# Patient Record
Sex: Male | Born: 1985 | Race: Black or African American | Hispanic: No | Marital: Single | Smoking: Current some day smoker
Health system: Southern US, Community
[De-identification: ages and names within clinical notes are randomized; demographics above are authoritative.]

---

## 2014-12-06 ENCOUNTER — Emergency Department (HOSPITAL_COMMUNITY)
Admission: EM | Admit: 2014-12-06 | Discharge: 2014-12-06 | Disposition: A | Discharge status: Home / Self Care | Payer: Self-pay | Attending: Emergency Medicine | Admitting: Emergency Medicine

## 2014-12-06 ENCOUNTER — Encounter (HOSPITAL_COMMUNITY): Payer: Self-pay | Admitting: *Deleted

## 2014-12-06 ENCOUNTER — Emergency Department (HOSPITAL_COMMUNITY): Payer: Self-pay

## 2014-12-06 DIAGNOSIS — Z720 Tobacco use: Secondary | ICD-10-CM | POA: Insufficient documentation

## 2014-12-06 DIAGNOSIS — R079 Chest pain, unspecified: Secondary | ICD-10-CM

## 2014-12-06 DIAGNOSIS — R110 Nausea: Secondary | ICD-10-CM | POA: Insufficient documentation

## 2014-12-06 LAB — CBC WITH DIFFERENTIAL/PLATELET
BASOS PCT: 0 % (ref 0–1)
Basophils Absolute: 0.1 10*3/uL (ref 0.0–0.1)
EOS ABS: 0.2 10*3/uL (ref 0.0–0.7)
EOS PCT: 2 % (ref 0–5)
HEMATOCRIT: 39.2 % (ref 39.0–52.0)
Hemoglobin: 13.1 g/dL (ref 13.0–17.0)
Lymphocytes Relative: 19 % (ref 12–46)
Lymphs Abs: 2.1 10*3/uL (ref 0.7–4.0)
MCH: 29.6 pg (ref 26.0–34.0)
MCHC: 33.4 g/dL (ref 30.0–36.0)
MCV: 88.7 fL (ref 78.0–100.0)
MONO ABS: 0.7 10*3/uL (ref 0.1–1.0)
MONOS PCT: 6 % (ref 3–12)
NEUTROS PCT: 73 % (ref 43–77)
Neutro Abs: 8.1 10*3/uL — ABNORMAL HIGH (ref 1.7–7.7)
Platelets: 256 10*3/uL (ref 150–400)
RBC: 4.42 MIL/uL (ref 4.22–5.81)
RDW: 13.2 % (ref 11.5–15.5)
WBC: 11.2 10*3/uL — ABNORMAL HIGH (ref 4.0–10.5)

## 2014-12-06 LAB — COMPREHENSIVE METABOLIC PANEL
ALK PHOS: 58 U/L (ref 39–117)
ALT: 12 U/L (ref 0–53)
ANION GAP: 3 — AB (ref 5–15)
AST: 23 U/L (ref 0–37)
Albumin: 4.5 g/dL (ref 3.5–5.2)
BILIRUBIN TOTAL: 0.3 mg/dL (ref 0.3–1.2)
BUN: 13 mg/dL (ref 6–23)
CHLORIDE: 106 meq/L (ref 96–112)
CO2: 28 mmol/L (ref 19–32)
CREATININE: 1.16 mg/dL (ref 0.50–1.35)
Calcium: 9.4 mg/dL (ref 8.4–10.5)
GFR calc Af Amer: >90 mL/min (ref >90)
GFR, EST NON AFRICAN AMERICAN: 84 mL/min — AB (ref >90)
Glucose, Bld: 106 mg/dL — ABNORMAL HIGH (ref 70–99)
POTASSIUM: 3.5 mmol/L (ref 3.5–5.1)
SODIUM: 137 mmol/L (ref 135–145)
Total Protein: 7.6 g/dL (ref 6.0–8.3)

## 2014-12-06 MED ORDER — IBUPROFEN 800 MG PO TABS
800.0000 mg | ORAL_TABLET | Freq: Once | ORAL | Status: AC
  Administered 2014-12-06: 800 mg via ORAL
  Filled 2014-12-06: qty 1

## 2014-12-06 NOTE — ED Notes (Signed)
Pt c/o left sided chest pain that radiates to his back and states the pain gets worse when he lays down at night

## 2014-12-06 NOTE — Discharge Instructions (Signed)
[facility] Primary Care Doctor List    Kari Baars MD. Specialty: Pulmonary Disease Contact information: [address]  [address]  [place] [address]  [phone]   Syliva Overman, MD. Specialty: [facility] Medicine Contact information: [address]  [place] [address]  [phone]   Lilyan Punt, MD. Specialty: [facility] Medicine Contact information: [address]  [address]  [place] [address]  [phone]   Avon Gully, MD Specialty: Internal Medicine Contact information: [address] [place] [address]  [phone]   Catalina Pizza, MD. Specialty: Internal Medicine Contact information: [address]  [place] [address]  [phone]   Butch Penny, MD. Specialty: Family Medicine Contact information: [address]  [place] [address]  [phone]   John Giovanni, MD. Specialty: Family Medicine Contact information: [address]  [address]  [place] [address]  [phone]   Carylon Perches, MD. Specialty: Internal Medicine Contact information: [phone]  [address]  [place] [address]  [phone]     Chest Pain (Nonspecific) It is often hard to give a specific diagnosis for the cause of chest pain. There is always a chance that your pain could be related to something serious, such as a heart attack or a blood clot in the lungs. You need to follow up with your health care provider for further evaluation. CAUSES   Heartburn.  Pneumonia or bronchitis.  Anxiety or stress.  Inflammation around your heart (pericarditis) or lung (pleuritis or pleurisy).  A blood clot in the lung.  A collapsed lung (pneumothorax). It can develop suddenly on its own (spontaneous pneumothorax) or from trauma to the chest.  Shingles infection (herpes zoster virus). The chest wall is composed of bones, muscles, and cartilage. Any of these can be the source of the pain.  The bones can be bruised by  injury.  The muscles or cartilage can be strained by coughing or overwork.  The cartilage can be affected by inflammation and become sore (costochondritis). DIAGNOSIS  Lab tests or other studies may be needed to find the cause of your pain. Your health care provider may have you take a test called an ambulatory electrocardiogram (ECG). An ECG records your heartbeat patterns over a 24-hour period. You may also have other tests, such as:  Transthoracic echocardiogram (TTE). During echocardiography, sound waves are used to evaluate how blood flows through your heart.  Transesophageal echocardiogram (TEE).  Cardiac monitoring. This allows your health care provider to monitor your heart rate and rhythm in real time.  Holter monitor. This is a portable device that records your heartbeat and can help diagnose heart arrhythmias. It allows your health care provider to track your heart activity for several days, if needed.  Stress tests by exercise or by giving medicine that makes the heart beat faster. TREATMENT   Treatment depends on what may be causing your chest pain. Treatment may include:  Acid blockers for heartburn.  Anti-inflammatory medicine.  Pain medicine for inflammatory conditions.  Antibiotics if an infection is present.  You may be advised to change lifestyle habits. This includes stopping smoking and avoiding alcohol, caffeine, and chocolate.  You may be advised to keep your head raised (elevated) when sleeping. This reduces the chance of acid going backward from your stomach into your esophagus. Most of the time, nonspecific chest pain will improve within 2-3 days with rest and mild pain medicine.  HOME CARE INSTRUCTIONS   If antibiotics  were prescribed, take them as directed. Finish them even if you start to feel better.  For the next few days, avoid physical activities that bring on chest pain. Continue physical activities as directed.  Do not use any tobacco  products, including cigarettes, chewing tobacco, or electronic cigarettes.  Avoid drinking alcohol.  Only take medicine as directed by your health care provider.  Follow your health care provider's suggestions for further testing if your chest pain does not go away.  Keep any follow-up appointments you made. If you do not go to an appointment, you could develop lasting (chronic) problems with pain. If there is any problem keeping an appointment, call to reschedule. SEEK MEDICAL CARE IF:   Your chest pain does not go away, even after treatment.  You have a rash with blisters on your chest.  You have a fever. SEEK IMMEDIATE MEDICAL CARE IF:   You have increased chest pain or pain that spreads to your arm, neck, jaw, back, or abdomen.  You have shortness of breath.  You have an increasing cough, or you cough up blood.  You have severe back or abdominal pain.  You feel nauseous or vomit.  You have severe weakness.  You faint.  You have chills. This is an emergency. Do not wait to see if the pain will go away. Get medical help at once. Call your local emergency services (911 in [place] Do not drive yourself to the hospital. MAKE SURE YOU:   Understand these instructions.  Will watch your condition.  Will get help right away if you are not doing well or get worse. Document Released: 08/29/2005 Document Revised: 11/24/2013 Document Reviewed: 06/24/2008 [facility] Patient Information 2015 [place], [place]. This information is not intended to replace advice given to you by your health care provider. Make sure you discuss any questions you have with your health care provider.

## 2014-12-06 NOTE — ED Provider Notes (Signed)
CSN: [id number]     Arrival date & time 12/06/14  1912 History  This chart was scribed for Vida Roller, MD by Annye Asa, ED Scribe. This patient was seen in room [facility]2/[facility] and the patient's care was started at 8:46 PM.    Chief Complaint  Patient presents with  . Chest Pain   The history is provided by the patient. No language interpreter was used.     HPI Comments: Ronnie Olson is a 29 y.o. male who presents to the Emergency Department complaining of 4 days of bilateral chest pains, extending to both axilla. He also reports nausea and decreased appetite; he states that he is eating normally but is losing weight. Yesterday, patient reports that he felt dizzy during activity. 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 He denies diarrhea, leg swelling.   He visited the ED in [place] approx. 1 month PTA with heart palpitation sensations, back pain, weakness in his arms. He was told at that time that his symptoms were due to stress; he was given a prescription for ibuprofen.   He denies any personal or family history of cardiac issues or unexpected death.   Patient is a smoker. He is an occasional EtOH and marijuana user.   History reviewed. No pertinent past medical history. History reviewed. No pertinent past surgical history. History reviewed. No pertinent family history. History  Substance Use Topics  . Smoking status: Current Some Day Smoker -- 0.50 packs/day  . Smokeless tobacco: Not on file  . Alcohol Use: Yes     Comment: occasionally    Review of Systems  Cardiovascular: Positive for chest pain.  All other systems reviewed and are negative.   Allergies  Review of patient's allergies indicates no known allergies.  Home Medications   Prior to Admission medications   Not on File   BP 124/74 mmHg  Pulse 72  Temp(Src) 98.6 F (37 C) (Oral)  Resp 15  Ht  (1.803 m)  Wt 140 lb (63.504 kg)   BMI 19.53 kg/m2  SpO2 99% Physical Exam  Constitutional: He appears well-developed and well-nourished.  Thin but well appearing  HENT:  Head: Normocephalic and atraumatic.  Eyes: Conjunctivae are normal. Right eye exhibits no discharge. Left eye exhibits no discharge.  Pulmonary/Chest: Effort normal. No respiratory distress.  Neurological: He is alert. Coordination normal.  Skin: Skin is warm and dry. No rash noted. He is not diaphoretic. No erythema.  Psychiatric: He has a normal mood and affect.  Nursing note and vitals reviewed.   ED Course  Procedures   DIAGNOSTIC STUDIES: Oxygen Saturation is 100% on RA, normal by my interpretation.    COORDINATION OF CARE: 8:51 PM Discussed treatment plan with pt at bedside and pt agreed to plan.   Labs Review Labs Reviewed  CBC WITH DIFFERENTIAL - Abnormal; Notable for the following:    WBC 11.2 (*)    Neutro Abs 8.1 (*)    All other components within normal limits  COMPREHENSIVE METABOLIC PANEL - Abnormal; Notable for the following:    Glucose, Bld 106 (*)    GFR calc non Af Amer 84 (*)    Anion gap 3 (*)    All other components within normal limits    Imaging Review Dg Chest 2 View  12/06/2014   CLINICAL DATA:  LEFT side chest pain radiating to back, worse when lying down at night, unspecified duration, smoker  EXAM: CHEST  2 VIEW  COMPARISON:  None  FINDINGS: Normal heart size, mediastinal contours, and pulmonary vascularity.  Lungs clear.  No pneumothorax.  Bones unremarkable.  IMPRESSION: No acute abnormalities.   Electronically Signed   By: Ulyses Southward M.D.   On: 12/06/2014 20:08     EKG Interpretation   Date/Time:  Monday December 06 2014 21:01:06 EST Ventricular Rate:  74 PR Interval:  180 QRS Duration: 99 QT Interval:  386 QTC Calculation: 428 R Axis:   79 Text Interpretation:  Sinus rhythm ST elev, probable normal early repol  pattern No old tracing to compare Confirmed by Adianna Darwin  MD, Diona Peregoy ([id number])  on 12/06/2014  10:05:36 PM      MDM   Final diagnoses:  Chest pain, unspecified chest pain type    The patient is very low risk for heart disease, he has no exertional symptoms, his pain comes on at rest. His EKG shows early repolarization but no acute findings, his chest x-ray is negative and his lab work is unremarkable. I discussed these findings with the patient, he appears stable for discharge, he can follow-up in the outpatient setting. I have given him a list of family doctors and he is amenable to this plan.   I personally performed the services described in this documentation, which was scribed in my presence. The recorded information has been reviewed and is accurate.       Vida Roller, MD 12/06/14 2206

## 2015-08-23 ENCOUNTER — Emergency Department (HOSPITAL_COMMUNITY): Payer: Self-pay

## 2015-08-23 ENCOUNTER — Emergency Department (HOSPITAL_COMMUNITY)
Admission: EM | Admit: 2015-08-23 | Discharge: 2015-08-23 | Disposition: A | Discharge status: Home / Self Care | Payer: Self-pay | Attending: Emergency Medicine | Admitting: Emergency Medicine

## 2015-08-23 ENCOUNTER — Encounter (HOSPITAL_COMMUNITY): Payer: Self-pay | Admitting: Emergency Medicine

## 2015-08-23 DIAGNOSIS — Z720 Tobacco use: Secondary | ICD-10-CM | POA: Insufficient documentation

## 2015-08-23 DIAGNOSIS — M5432 Sciatica, left side: Secondary | ICD-10-CM | POA: Insufficient documentation

## 2015-08-23 MED ORDER — PREDNISONE 50 MG PO TABS
60.0000 mg | ORAL_TABLET | Freq: Once | ORAL | Status: AC
  Administered 2015-08-23: 60 mg via ORAL
  Filled 2015-08-23 (×2): qty 1

## 2015-08-23 MED ORDER — HYDROCODONE-ACETAMINOPHEN 5-325 MG PO TABS: 1.0000 | ORAL_TABLET | ORAL | Status: DC | PRN

## 2015-08-23 MED ORDER — PREDNISONE 10 MG PO TABS: ORAL_TABLET | ORAL | Status: AC

## 2015-08-23 MED ORDER — HYDROCODONE-ACETAMINOPHEN 5-325 MG PO TABS
1.0000 | ORAL_TABLET | Freq: Once | ORAL | Status: AC
  Administered 2015-08-23: 1 via ORAL
  Filled 2015-08-23: qty 1

## 2015-08-23 MED ORDER — METHOCARBAMOL 500 MG PO TABS
1000.0000 mg | ORAL_TABLET | Freq: Once | ORAL | Status: AC
  Administered 2015-08-23: 1000 mg via ORAL
  Filled 2015-08-23: qty 2

## 2015-08-23 MED ORDER — METHOCARBAMOL 500 MG PO TABS: 1000.0000 mg | ORAL_TABLET | Freq: Four times a day (QID) | ORAL | Status: AC

## 2015-08-23 NOTE — ED Notes (Addendum)
Pt reports was lifting a crate of potatoes at work and reports back pain ever since. Pt denies any gi/gu symptoms. Pt reports pain is radiating to left leg.

## 2015-08-23 NOTE — Discharge Instructions (Signed)
Sciatica °Sciatica is pain, weakness, numbness, or tingling along the path of the sciatic nerve. The nerve starts in the lower back and runs down the back of each leg. The nerve controls the muscles in the lower leg and in the back of the knee, while also providing sensation to the back of the thigh, lower leg, and the sole of your foot. Sciatica is a symptom of another medical condition. For instance, nerve damage or certain conditions, such as a herniated disk or bone spur on the spine, pinch or put pressure on the sciatic nerve. This causes the pain, weakness, or other sensations normally associated with sciatica. Generally, sciatica only affects one side of the body. °CAUSES  °· Herniated or slipped disc. °· Degenerative disk disease. °· A pain disorder involving the narrow muscle in the buttocks (piriformis syndrome). °· Pelvic injury or fracture. °· Pregnancy. °· Tumor (rare). °SYMPTOMS  °Symptoms can vary from mild to very severe. The symptoms usually travel from the low back to the buttocks and down the back of the leg. Symptoms can include: °· Mild tingling or dull aches in the lower back, leg, or hip. °· Numbness in the back of the calf or sole of the foot. °· Burning sensations in the lower back, leg, or hip. °· Sharp pains in the lower back, leg, or hip. °· Leg weakness. °· Severe back pain inhibiting movement. °These symptoms may get worse with coughing, sneezing, laughing, or prolonged sitting or standing. Also, being overweight may worsen symptoms. °DIAGNOSIS  °Your caregiver will perform a physical exam to look for common symptoms of sciatica. He or she may ask you to do certain movements or activities that would trigger sciatic nerve pain. Other tests may be performed to find the cause of the sciatica. These may include: °· Blood tests. °· X-rays. °· Imaging tests, such as an MRI or CT scan. °TREATMENT  °Treatment is directed at the cause of the sciatic pain. Sometimes, treatment is not necessary  and the pain and discomfort goes away on its own. If treatment is needed, your caregiver may suggest: °· Over-the-counter medicines to relieve pain. °· Prescription medicines, such as anti-inflammatory medicine, muscle relaxants, or narcotics. °· Applying heat or ice to the painful area. °· Steroid injections to lessen pain, irritation, and inflammation around the nerve. °· Reducing activity during periods of pain. °· Exercising and stretching to strengthen your abdomen and improve flexibility of your spine. Your caregiver may suggest losing weight if the extra weight makes the back pain worse. °· Physical therapy. °· Surgery to eliminate what is pressing or pinching the nerve, such as a bone spur or part of a herniated disk. °HOME CARE INSTRUCTIONS  °· Only take over-the-counter or prescription medicines for pain or discomfort as directed by your caregiver. °· Apply ice to the affected area for 20 minutes, 3-4 times a day for the first 48-72 hours. Then try heat in the same way. °· Exercise, stretch, or perform your usual activities if these do not aggravate your pain. °· Attend physical therapy sessions as directed by your caregiver. °· Keep all follow-up appointments as directed by your caregiver. °· Do not wear high heels or shoes that do not provide proper support. °· Check your mattress to see if it is too soft. A firm mattress may lessen your pain and discomfort. °SEEK IMMEDIATE MEDICAL CARE IF:  °· You lose control of your bowel or bladder (incontinence). °· You have increasing weakness in the lower back, pelvis, buttocks,   or legs.  You have redness or swelling of your back.  You have a burning sensation when you urinate.  You have pain that gets worse when you lie down or awakens you at night.  Your pain is worse than you have experienced in the past.  Your pain is lasting longer than 4 weeks.  You are suddenly losing weight without reason. MAKE SURE YOU:  Understand these  instructions.  Will watch your condition.  Will get help right away if you are not doing well or get worse. Document Released: 11/13/2001 Document Revised: 05/20/2012 Document Reviewed: 03/30/2012 [facility] Patient Information 2015 [place], [place]. This information is not intended to replace advice given to you by your health care provider. Make sure you discuss any questions you have with your health care provider.   Take your next dose of prednisone tomorrow evening.  Use the the other medicines as directed.  Do not drive within 4 hours of taking hydrocodone as this will make you drowsy.  Avoid lifting,  Bending,  Twisting or any other activity that worsens your pain over the next week.  Apply a heating pad to your lower back for 20 minutes 3 times daily  You should get rechecked if your symptoms are not better over the next 5 days,  Or you develop increased pain,  Weakness in your leg(s) or loss of bladder or bowel function - these are symptoms of a worsening injury.

## 2015-08-23 NOTE — ED Provider Notes (Signed)
CSN: [id number]     Arrival date & time 08/23/15  1622 History   First MD Initiated Contact with Patient 08/23/15 1641     Chief Complaint  Patient presents with  . Back Pain     (Consider location/radiation/quality/duration/timing/severity/associated sxs/prior Treatment) The history is provided by the patient and the spouse.   Ronnie Olson is a 29 y.o. male presenting with acute low back pain with radiation into his left lateral mid thigh.  He was lifting a crate of potatoes at work [employer]) 4 days ago when he felt a pulling type strain when he bent to put the box down, with worsened pain since.  Pain is sharp and radiates to his mid left thigh and is worsened with movement in certain positions.  He is able to get relief when supine, but has increased pain briefly while his muscles "relax" when he first lies down.  He has borrowed a baclofen tablet from his mother which has helped his symptoms briefly.  He denies any urinary or fecal incontinence or retention.  He is no IV drug use, no cancer history.  He denies prior history of low back pain or injury.    History reviewed. No pertinent past medical history. History reviewed. No pertinent past surgical history. History reviewed. No pertinent family history. Social History  Substance Use Topics  . Smoking status: Current Some Day Smoker -- 0.50 packs/day  . Smokeless tobacco: None  . Alcohol Use: Yes     Comment: occasionally    Review of Systems  Constitutional: Negative for fever.  Respiratory: Negative for shortness of breath.   Cardiovascular: Negative for chest pain and leg swelling.  Gastrointestinal: Negative for abdominal pain, constipation and abdominal distention.  Genitourinary: Negative for dysuria, urgency, frequency, flank pain and difficulty urinating.  Musculoskeletal: Positive for back pain. Negative for joint swelling and gait problem.  Skin: Negative for rash.  Neurological: Negative for weakness and  numbness.      Allergies  Review of patient's allergies indicates no known allergies.  Home Medications   Prior to Admission medications   Medication Sig Start Date End Date Taking? Authorizing Provider  acetaminophen (SM 8 HOUR PAIN RELIEF) 650 MG CR tablet Take 650 mg by mouth every 8 (eight) hours as needed for pain.   Yes Historical Provider, MD   BP 126/70 mmHg  Pulse 68  Temp(Src) 98.1 F (36.7 C) (Oral)  Resp 16  Ht  (1.778 m)  Wt 140 lb (63.504 kg)  BMI 20.09 kg/m2  SpO2 100% Physical Exam  Constitutional: He appears well-developed and well-nourished.  HENT:  Head: Normocephalic.  Eyes: Conjunctivae are normal.  Neck: Normal range of motion. Neck supple.  Cardiovascular: Normal rate and intact distal pulses.   Pedal pulses normal.  Pulmonary/Chest: Effort normal.  Abdominal: Soft. Bowel sounds are normal. He exhibits no distension and no mass.  Musculoskeletal: Normal range of motion. He exhibits no edema.       Lumbar back: He exhibits tenderness. He exhibits no swelling, no edema and no spasm.  Neurological: He is alert. He has normal strength. He displays no atrophy and no tremor. No sensory deficit. Gait normal.  Reflex Scores:      Patellar reflexes are 2+ on the right side and 2+ on the left side.      Achilles reflexes are 2+ on the right side and 2+ on the left side. No strength deficit noted in hip and knee flexor and extensor muscle groups.  Ankle  flexion and extension intact.  Bilateral positive straight leg raise at 30.  Skin: Skin is warm and dry.  Psychiatric: He has a normal mood and affect.  Nursing note and vitals reviewed.   ED Course  Procedures (including critical care time) Labs Review Labs Reviewed - No data to display  Imaging Review Dg Lumbar Spine Complete  08/23/2015   CLINICAL DATA:  Back pain since lifting a crate of potatoes at work, low back pain radiating to LEFT leg  EXAM: LUMBAR SPINE - COMPLETE 4+ VIEW  COMPARISON:   None  FINDINGS: Five non-rib-bearing lumbar vertebra.  Osseous mineralization normal.  Vertebral body and disc space heights maintained.  No acute fracture, subluxation or bone destruction.  No spondylolysis.  SI joints symmetric.  IMPRESSION: No acute abnormalities.   Electronically Signed   By: Ulyses Southward M.D.   On: 08/23/2015 18:11   I have personally reviewed and evaluated these images and lab results as part of my medical decision-making.   EKG Interpretation None      MDM   Final diagnoses:  Sciatica neuralgia, left    Pt was given Robaxin, prednisone and hydrocodone.  Pain improving.  xrays negative for acute injury.  He will be prescribed prednisone taper, hydrocodone, flexeril in place of robaxin given pt will be self pay for insurance.  Advised heat tx, activity as tolerated, f/u with pcp if sx persist or worsen.    Burgess Amor, PA-C 08/23/15 1854  Mancel Bale, MD 08/23/15 [phone]

## 2016-12-24 IMAGING — DX DG LUMBAR SPINE COMPLETE 4+V
5 series · 5 of 5 positions shown · non-contrast
Comparison: None

CLINICAL DATA: Back pain since lifting a crate of potatoes at work,
low back pain radiating to LEFT leg

EXAM:
LUMBAR SPINE - COMPLETE 4+ VIEW

[l-spine ap]
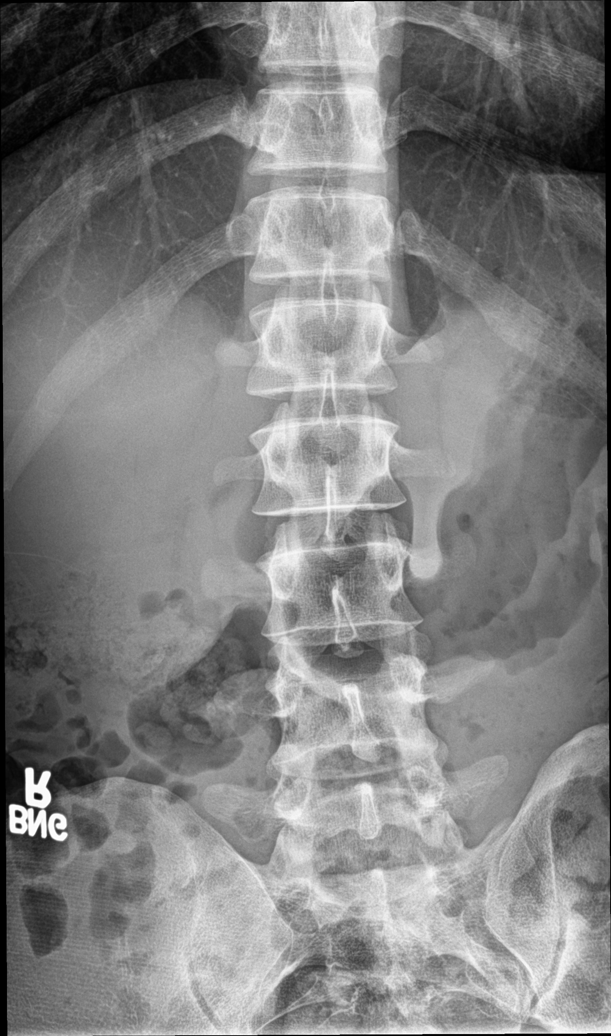

[l-spine obl (1 of 2)]
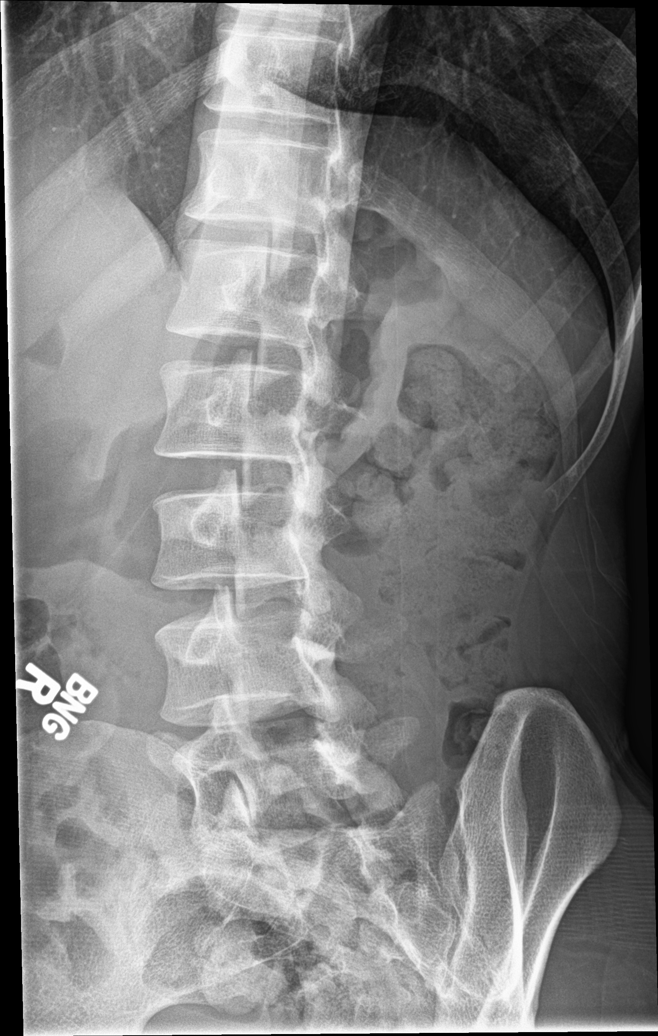

[l-spine obl (2 of 2)]
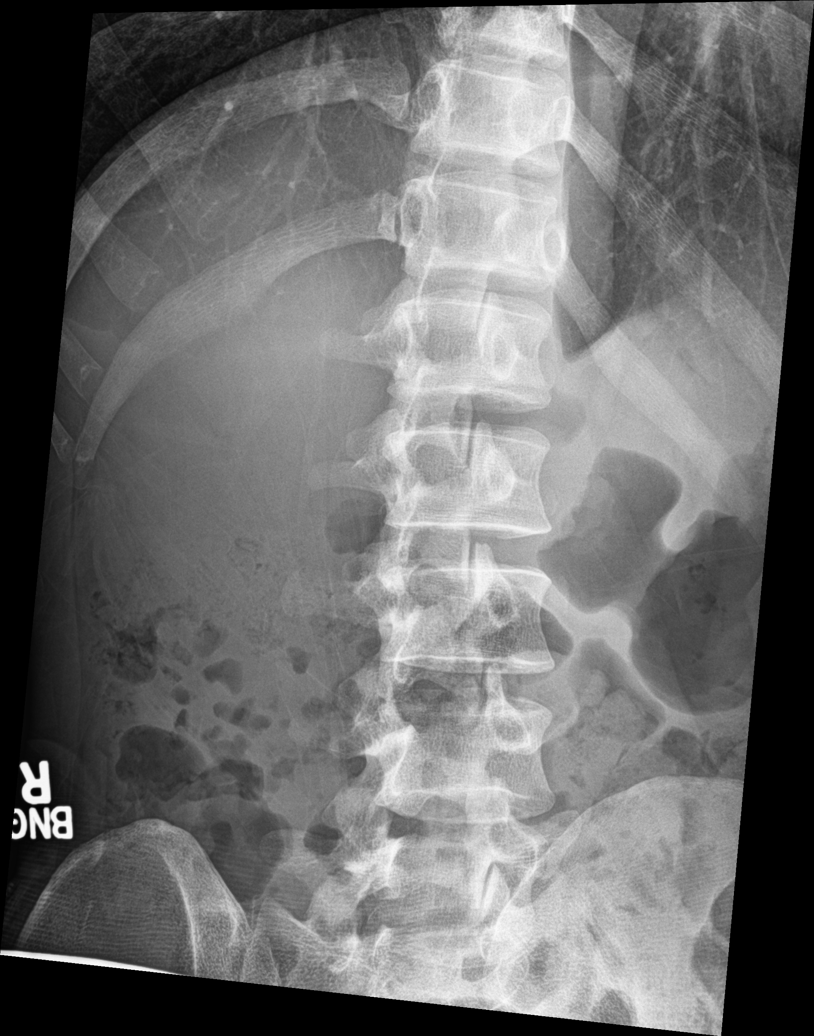

[l-spine lat]
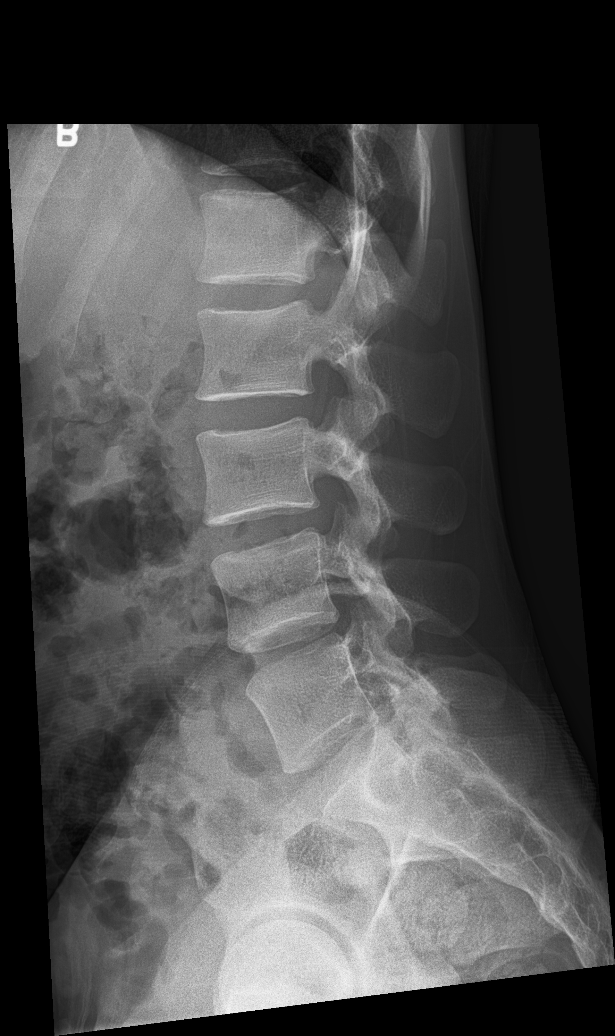

[l-spine spot]
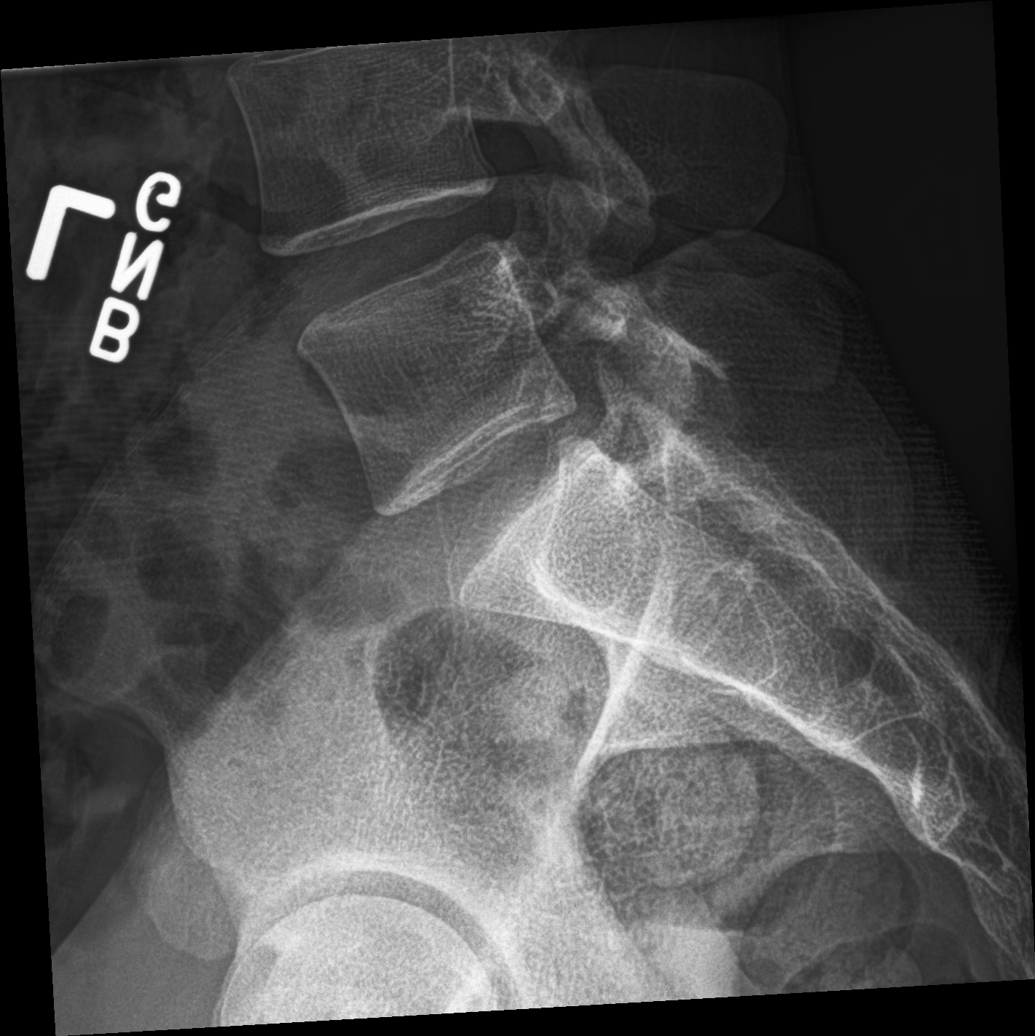

[5 of 5 positions shown; findings below may reference images not displayed]

FINDINGS: Five non-rib-bearing lumbar vertebra.

Osseous mineralization normal.

Vertebral body and disc space heights maintained.

No acute fracture, subluxation or bone destruction.

No spondylolysis.

SI joints symmetric.
IMPRESSION: No acute abnormalities.

## 2017-01-17 ENCOUNTER — Emergency Department (HOSPITAL_COMMUNITY): Payer: Self-pay

## 2017-01-17 ENCOUNTER — Emergency Department (HOSPITAL_COMMUNITY)
Admission: EM | Admit: 2017-01-17 | Discharge: 2017-01-17 | Disposition: A | Discharge status: Home / Self Care | Payer: Self-pay | Attending: Emergency Medicine | Admitting: Emergency Medicine

## 2017-01-17 ENCOUNTER — Encounter (HOSPITAL_COMMUNITY): Payer: Self-pay

## 2017-01-17 DIAGNOSIS — R0602 Shortness of breath: Secondary | ICD-10-CM | POA: Insufficient documentation

## 2017-01-17 DIAGNOSIS — F17210 Nicotine dependence, cigarettes, uncomplicated: Secondary | ICD-10-CM | POA: Insufficient documentation

## 2017-01-17 DIAGNOSIS — R0789 Other chest pain: Secondary | ICD-10-CM | POA: Insufficient documentation

## 2017-01-17 DIAGNOSIS — R42 Dizziness and giddiness: Secondary | ICD-10-CM | POA: Insufficient documentation

## 2017-01-17 DIAGNOSIS — R079 Chest pain, unspecified: Secondary | ICD-10-CM

## 2017-01-17 DIAGNOSIS — Z7982 Long term (current) use of aspirin: Secondary | ICD-10-CM | POA: Insufficient documentation

## 2017-01-17 DIAGNOSIS — R110 Nausea: Secondary | ICD-10-CM | POA: Insufficient documentation

## 2017-01-17 LAB — CBC
HEMATOCRIT: 40.9 % (ref 39.0–52.0)
Hemoglobin: 13.9 g/dL (ref 13.0–17.0)
MCH: 30.1 pg (ref 26.0–34.0)
MCHC: 34 g/dL (ref 30.0–36.0)
MCV: 88.5 fL (ref 78.0–100.0)
PLATELETS: 239 10*3/uL (ref 150–400)
RBC: 4.62 MIL/uL (ref 4.22–5.81)
RDW: 13.4 % (ref 11.5–15.5)
WBC: 8.1 10*3/uL (ref 4.0–10.5)

## 2017-01-17 LAB — BASIC METABOLIC PANEL
Anion gap: 6 (ref 5–15)
BUN: 13 mg/dL (ref 6–20)
CO2: 29 mmol/L (ref 22–32)
Calcium: 9.5 mg/dL (ref 8.9–10.3)
Chloride: 102 mmol/L (ref 101–111)
Creatinine, Ser: 1.09 mg/dL (ref 0.61–1.24)
GFR calc Af Amer: >60 mL/min (ref >60)
GFR calc non Af Amer: >60 mL/min (ref >60)
Glucose, Bld: 89 mg/dL (ref 65–99)
POTASSIUM: 4 mmol/L (ref 3.5–5.1)
Sodium: 137 mmol/L (ref 135–145)

## 2017-01-17 LAB — TROPONIN I: Troponin I: <0.03 ng/mL (ref <0.03)

## 2017-01-17 NOTE — Discharge Instructions (Signed)
Tests were all normal. Tylenol or ibuprofen for pain. Recommend primary care follow-up

## 2017-01-17 NOTE — ED Provider Notes (Signed)
[facility]-EM[facility]Y DEPT Provider Note   CSN: [id number] Arrival date & time: 01/17/17  1912 By signing my name below, I, Ronnie Olson, attest that this documentation has been prepared under the direction and in the presence of Donnetta HutchingBrian Tekoa Amon, MD . Electronically Signed: Levon HedgerElizabeth Olson, Scribe. 01/17/2017. 11:38 PM.   History   Chief Complaint Chief Complaint  Patient presents with  . Chest Pain   HPI Ronnie KyleJeremy Sedlar is an otherwise healthy 31 y.o. male who presents to the Emergency Department complaining of sudden onset chest pain which began tonight. He describes the pain as left sided tightness and spasms which lasted for about 1 hour. Per pt, the pain was alleviated by taking aspirin and getting fresh air outside. He notes associated lightheadedness, left arm weakness, SOB, and nausea. 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 Pt states this is the second time he has had this type of pain. The patient is currently on no regular medications. No personal or family cardiac history. He is a current 8 cigarette per day smoker and endorses occasional alcohol use. He denies any diaphoresis, and has no other complaints at this time.   The history is provided by the patient. No language interpreter was used.    History reviewed. No pertinent past medical history.  There are no active problems to display for this patient.   History reviewed. No pertinent surgical history.   Home Medications    Prior to Admission medications   Medication Sig Start Date End Date Taking? Authorizing Provider  aspirin 325 MG tablet Take 650 mg by mouth once.   Yes Historical Provider, MD  predniSONE (DELTASONE) 10 MG tablet 6, 5, 4, 3, 2 then 1 tablet by mouth daily for 6 days total. 08/23/15   Burgess AmorJulie Idol, PA-C    Family History History reviewed. No pertinent family history.  Social History Social History  Substance Use Topics  . Smoking status: Current Some Day Smoker    Packs/day: 0.50  . Smokeless  tobacco: Never Used  . Alcohol use Yes     Comment: occasionally    Allergies   Patient has no known allergies.  Review of Systems Review of Systems 10 systems reviewed and all are negative for acute change except as noted in the HPI.  Physical Exam Updated Vital Signs BP 101/70   Pulse 65   Temp 98.9 F (37.2 C)   Resp 14   Ht 5\' 10"  (1.778 m)   Wt 140 lb (63.5 kg)   SpO2 100%   BMI 20.09 kg/m   Physical Exam  Constitutional: He is oriented to person, place, and time. He appears well-developed and well-nourished.  HENT:  Head: Normocephalic and atraumatic.  Eyes: Conjunctivae are normal.  Neck: Neck supple.  Cardiovascular: Normal rate and regular rhythm.   Pulmonary/Chest: Effort normal and breath sounds normal.  Abdominal: Soft. Bowel sounds are normal.  Musculoskeletal: Normal range of motion.  Neurological: He is alert and oriented to person, place, and time.  Skin: Skin is warm and dry.  Psychiatric: He has a normal mood and affect. His behavior is normal.  Nursing note and vitals reviewed.  ED Treatments / Results  DIAGNOSTIC STUDIES:  Oxygen Saturation is 99% on RA, normal by my interpretation.    COORDINATION OF CARE:  9:28 PM Will order basic cardiac work-up. Discussed treatment plan with pt at bedside and pt agreed to plan.   Labs (all labs ordered are listed, but only abnormal results are  displayed) Labs Reviewed  BASIC METABOLIC PANEL  CBC  TROPONIN I    EKG  EKG Interpretation  Date/Time:  Thursday January 17 2017 19:17:58 EST Ventricular Rate:  82 PR Interval:  180 QRS Duration: 92 QT Interval:  360 QTC Calculation: 420 R Axis:   71 Text Interpretation:  Normal sinus rhythm with sinus arrhythmia Possible Left atrial enlargement Left ventricular hypertrophy Abnormal ECG Confirmed by Adriana Simas  MD, Lavell Supple ([id number]) on 01/17/2017 10:05:30 PM       Radiology Dg Chest 2 View  Result Date: 01/17/2017 CLINICAL DATA:  Chest pain, weakness,  dizziness shortness of breath. EXAM: CHEST  2 VIEW COMPARISON:  None. FINDINGS: The heart size and mediastinal contours are within normal limits. Both lungs are clear. The visualized skeletal structures are unremarkable. IMPRESSION: Normal chest x-ray. Electronically Signed   By: Rudie Meyer M.D.   On: 01/17/2017 19:56    Procedures Procedures (including critical care time)  Medications Ordered in ED Medications - No data to display   Initial Impression / Assessment and Plan / ED Course  I have reviewed the triage vital signs and the nursing notes.  Pertinent labs & imaging results that were available during my care of the patient were reviewed by me and considered in my medical decision making (see chart for details).     Patient is very low risk for ACS or PE. Screening labs, chest x-ray, EKG, troponin all negative.  Final Clinical Impressions(s) / ED Diagnoses   Final diagnoses:  Chest pain, unspecified type    New Prescriptions New Prescriptions   No medications on file  I personally performed the services described in this documentation, which was scribed in my presence. The recorded information has been reviewed and is accurate.     Donnetta Hutching, MD 01/17/17 [phone]

## 2017-01-17 NOTE — ED Triage Notes (Signed)
I was at work and it felt like someone had hit me in my chest bone.  Then I was having spasms in the left side of my chest and my left arm was feeling weak.  I became light headed and went to get something to drink to see if I felt better, but the pain in my left chest persisted.  It is tender in my chest in some spots.  I was also having a hard time breathing.  He has been evaluated for chest pain before per s/o.  He took two regular strength aspirin before coming here.

## 2018-05-21 IMAGING — DX DG CHEST 2V
2 series · 2 of 2 positions shown · non-contrast
Comparison: None.

CLINICAL DATA: Chest pain, weakness, dizziness shortness of breath.

EXAM:
CHEST  2 VIEW

[chest pa]
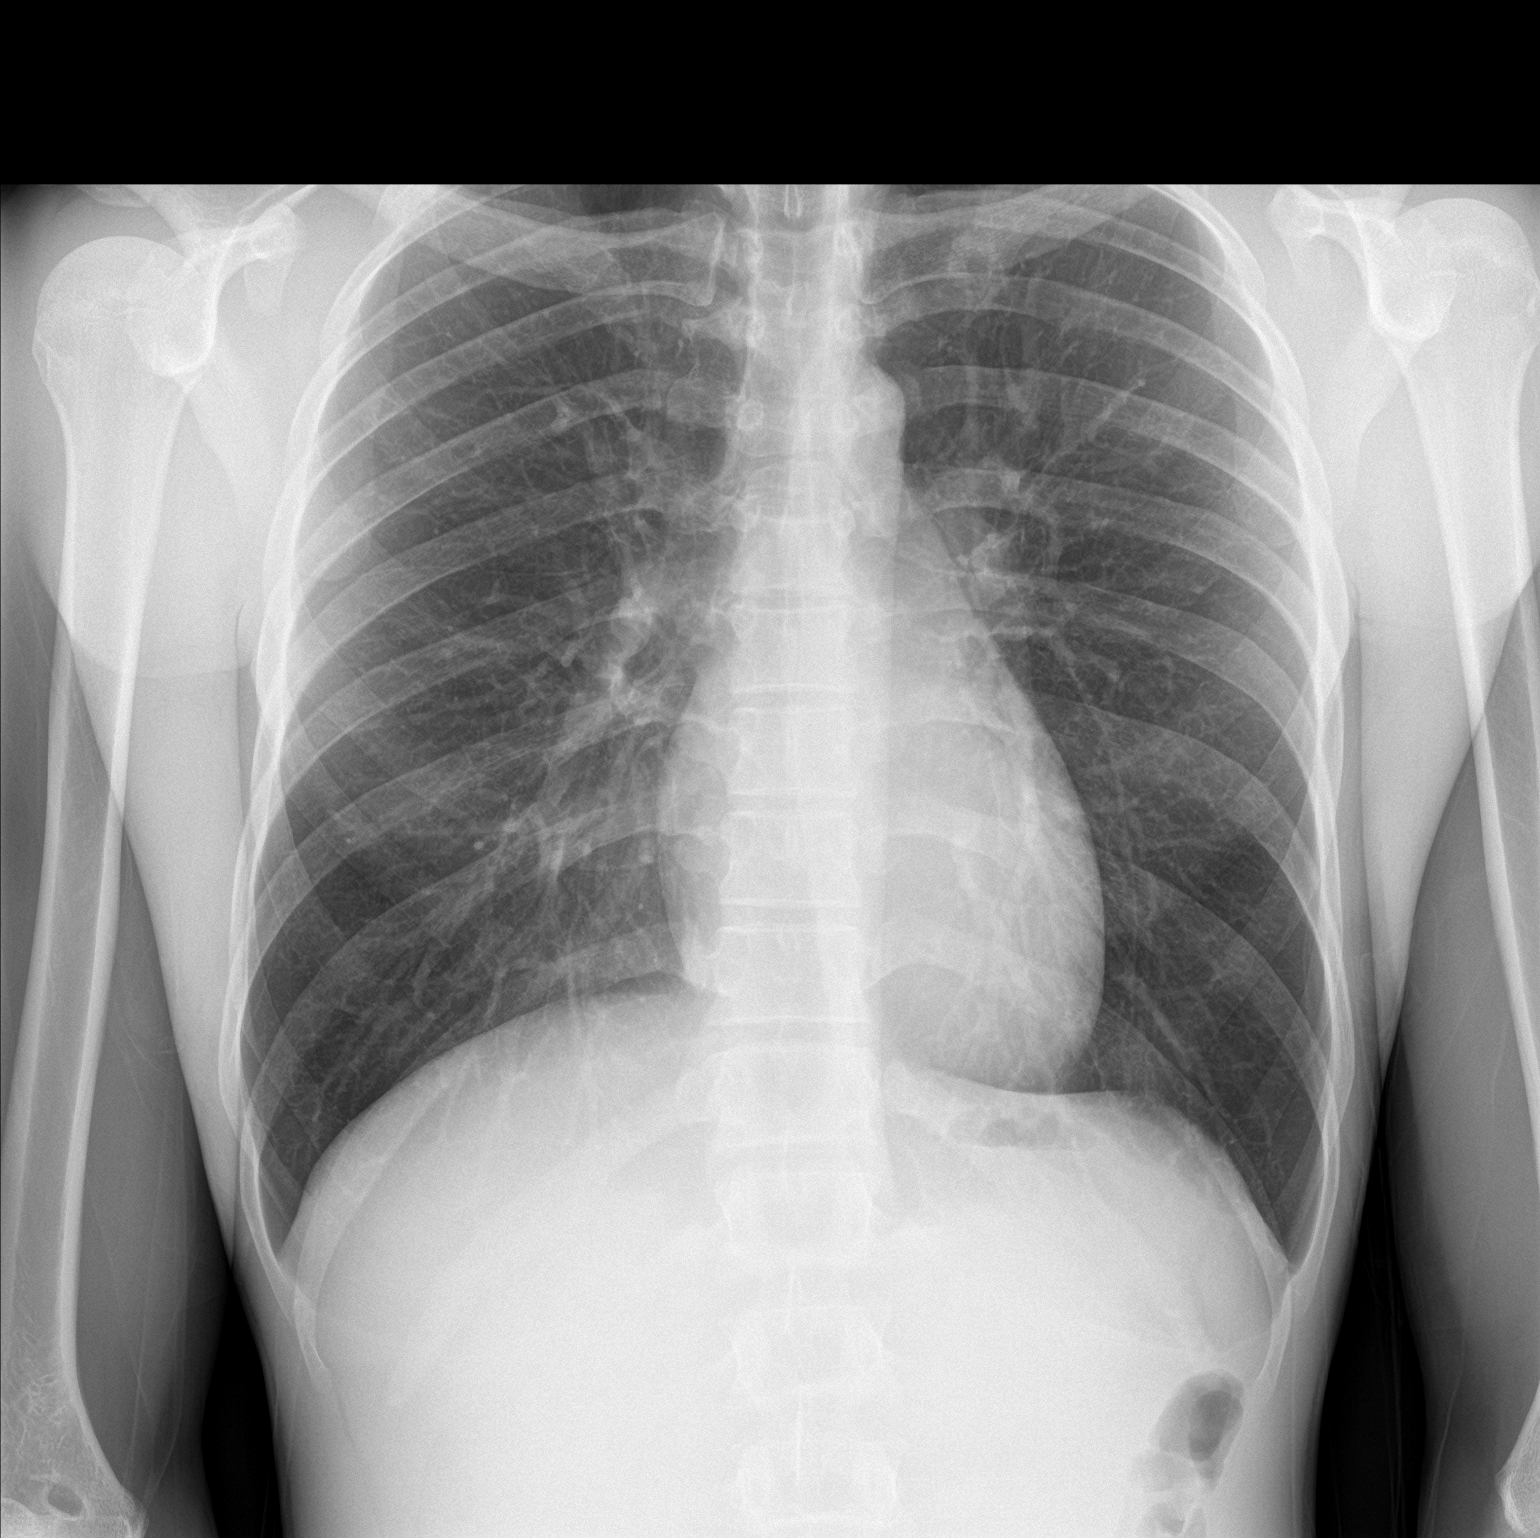

[chest lat]
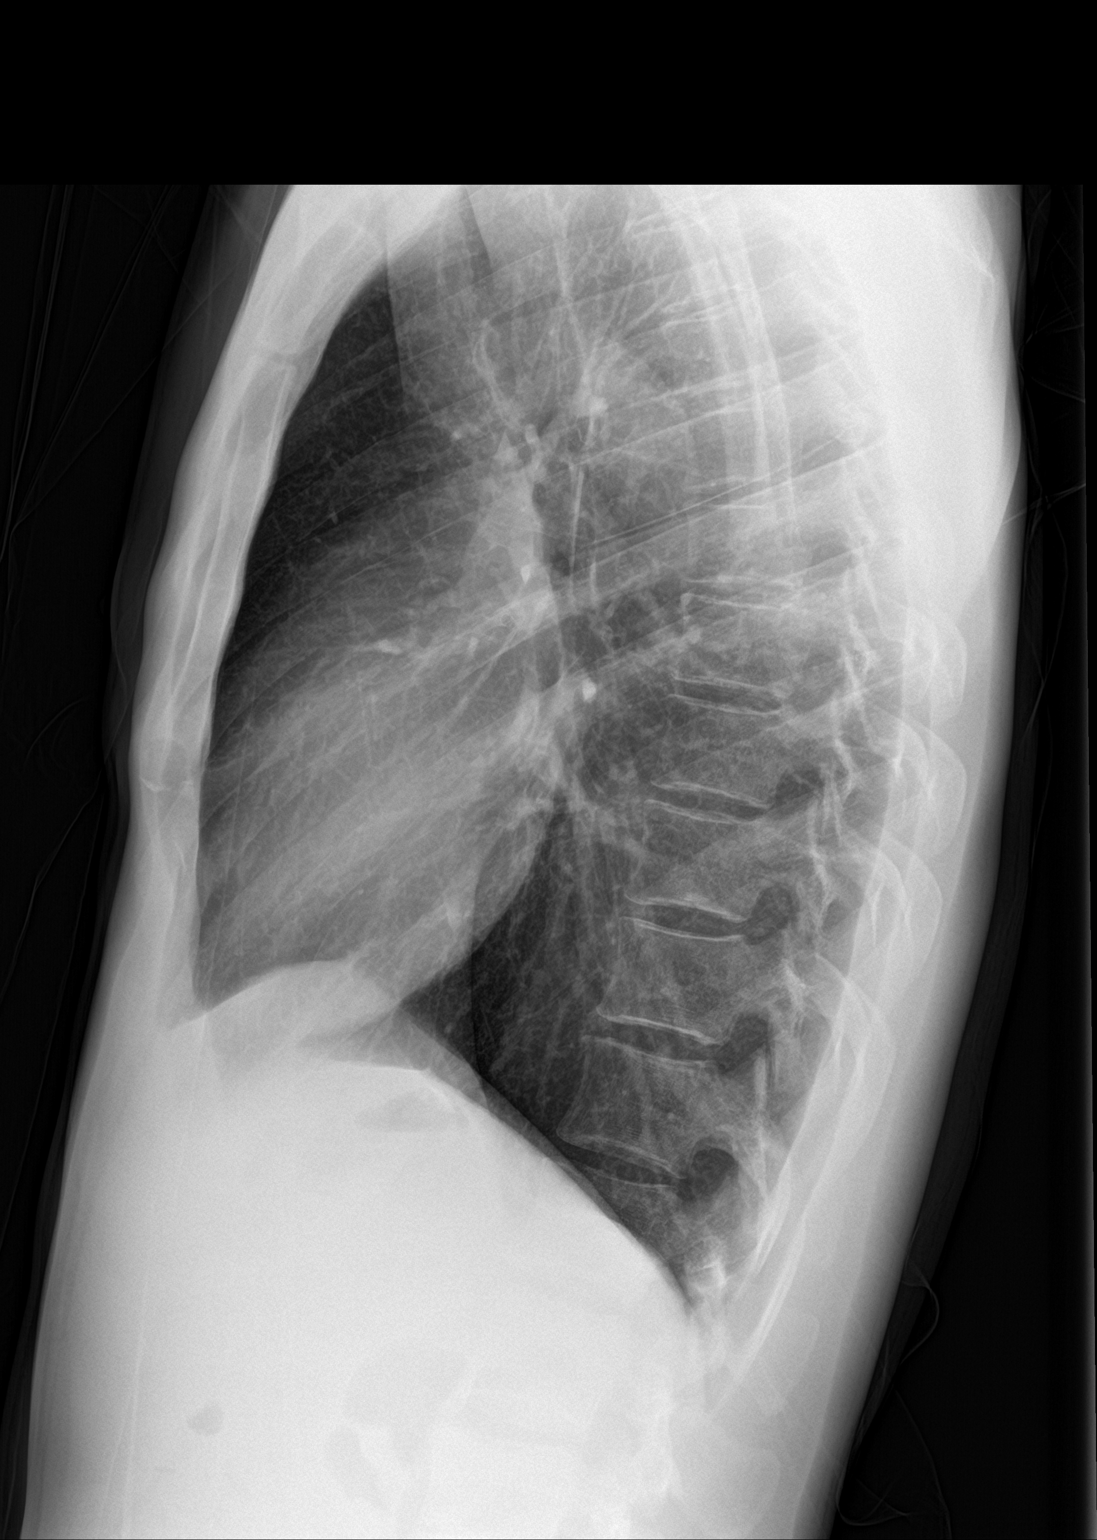

[2 of 2 positions shown; findings below may reference images not displayed]

FINDINGS: The heart size and mediastinal contours are within normal limits.
Both lungs are clear. The visualized skeletal structures are
unremarkable.
IMPRESSION: Normal chest x-ray.
